# Patient Record
Sex: Male | Born: 2000 | Race: White | Hispanic: No | Marital: Single | Smoking: Light tobacco smoker
Health system: Southern US, Community
[De-identification: ages and names within clinical notes are randomized; demographics above are authoritative.]

## PROBLEM LIST (undated history)

## (undated) DIAGNOSIS — J302 Other seasonal allergic rhinitis: Secondary | ICD-10-CM

## (undated) DIAGNOSIS — J219 Acute bronchiolitis, unspecified: Secondary | ICD-10-CM

## (undated) HISTORY — PX: CIRCUMCISION: SUR203

## (undated) HISTORY — PX: ELBOW FRACTURE SURGERY: SHX616

## (undated) HISTORY — PX: MYRINGOTOMY: SUR874

---
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
## 2020-05-27 ENCOUNTER — Ambulatory Visit: Payer: 59

## 2020-08-19 ENCOUNTER — Encounter (INDEPENDENT_AMBULATORY_CARE_PROVIDER_SITE_OTHER): Payer: Self-pay | Admitting: Student in an Organized Health Care Education/Training Program

## 2021-03-23 ENCOUNTER — Encounter: Payer: Self-pay | Admitting: Pediatrics
